# Patient Record
Sex: Female | Born: 1985 | Hispanic: No | Marital: Single
Health system: Midwestern US, Academic
[De-identification: ages and names within clinical notes are randomized; demographics above are authoritative.]

---

## 2018-11-29 ENCOUNTER — Encounter: Admit: 2018-11-29 | Discharge: 2018-11-30

## 2019-01-03 ENCOUNTER — Encounter: Admit: 2019-01-03 | Discharge: 2019-01-03

## 2019-01-03 NOTE — Telephone Encounter
Patient contacted for pre-visit information gathering r/t referral for migraines. Patient reports she went to the eye doctor and was prescribed glasses.  She has been wearing them for 3 weeks and has not had a migraine since. Reports father hx of brain cancer so that makes her nervous.    Referring provider: 11/27/2018 Alvarado   "Martha Cowan presents to the clinic today for c/o migraine wince 6/1 (1 week). Patient states she left work early and needs a note to go back to work. Hx migraines and at times needs to come in for a Toradol shot to help abort it. She has tried Tylenol and Ibuprofen which take the edge off but does not make the HA go completely away. Pain behind eyebrows and radiates up to hair line throughout bilateral forehead. Eyes water, but otherwise no vision trouble. She has been tried on Sumatriptan but states it did not work so she did not try it this time. Has never seen neuro for migraines. States they are becoming more frequent and lasting longer. She is scared because her father had a brain tumor that started with recurrent headaches. She also does not take Excedrin because of the aspirin and wants to keep her options open in case she ever needs emergent surgery. Headache mostly relieved with rest in a dark room. Does c/o light sensitivity. Denies n/v, fever, or sinus pain/pressure. No other concerns."    Previous neurologist: denies    Imaging: requested from [facility]      Sleep Study: denies      Recent ED visits (last 6 months) r/t chief complaint: denies but did go to same-day appointments with her PCP four times in 2.5 weeks for migraines.        Confirmed appointment for 01/05/2019 at 9:15am.  Also confirmed that patient aware of clinic location and visitor policy.

## 2019-01-05 ENCOUNTER — Encounter: Admit: 2019-01-05 | Discharge: 2019-01-05

## 2019-01-05 ENCOUNTER — Ambulatory Visit: Admit: 2019-01-05 | Discharge: 2019-01-06

## 2019-01-05 DIAGNOSIS — R51 Headache: Secondary | ICD-10-CM

## 2019-01-05 DIAGNOSIS — G43009 Migraine without aura, not intractable, without status migrainosus: Principal | ICD-10-CM

## 2019-01-05 DIAGNOSIS — G43909 Migraine, unspecified, not intractable, without status migrainosus: Secondary | ICD-10-CM

## 2019-01-05 MED ORDER — MAGNESIUM OXIDE 400 MG (241.3 MG MAGNESIUM) PO TAB
400 mg | ORAL_TABLET | Freq: Every day | ORAL | 4 refills | Status: AC
Start: 2019-01-05 — End: ?

## 2019-01-05 MED ORDER — ACETAMINOPHEN 500 MG PO TAB
ORAL_TABLET | 1 refills | Status: AC
Start: 2019-01-05 — End: ?

## 2019-01-05 MED ORDER — PROCHLORPERAZINE MALEATE 5 MG PO TAB
10 mg | ORAL_TABLET | ORAL | 2 refills | Status: AC | PRN
Start: 2019-01-05 — End: ?

## 2019-01-05 MED ORDER — NORTRIPTYLINE 10 MG PO CAP
ORAL_CAPSULE | Freq: Every evening | 8 refills | Status: AC
Start: 2019-01-05 — End: ?

## 2019-01-05 MED ORDER — DIPHENHYDRAMINE HCL 25 MG PO TAB
25 mg | ORAL_TABLET | ORAL | 2 refills | 6.00000 days | Status: AC | PRN
Start: 2019-01-05 — End: ?

## 2019-01-05 NOTE — Progress Notes
Date of Service: 01/05/2019    Subjective:             Martha Cowan is a 33 y.o. female coming for the first time to the Clinic for headaches.    History of Present Illness    33 yo female with h/o hypothyroidism, kidney stones, and hysterectomy, presenting to the clinic for evaluation of her headaches.    Patient started having headaches for years ago, they have always had similar characteristics.  Initially, they happened approximately once a year, however during the last year the frequency has increased to once per month, mainly associated with social and personal life stressors.  Frequency has become worse during the last 2 months, as she has had between 10 to 12 days of headache per month, prompting her to go to the ED multiple times in June.  Toradol helped during these visits, however pain came back after several hours.  She was also started on amitriptyline, and got prescription glasses, and she has not had any more headaches for the last 3 weeks.  Seems that she has been tolerating the medication, which has also helped with her sleep, however she does complain of some dry mouth.  She has been taking the amitriptyline inconsistently.     Her headaches are located behind her eyebrows, and can be of moderate to severe intensity, lasting from several hours to several days, especially in the last couple of months.  Headache has a sharp, pressure-like quality, and is bilateral.  She also has nausea, with occasional vomiting, phonophobia, photophobia, and lacrimation, reason for which other than Toradol, being in a dark room and sleeping helps somewhat. There are no other accompanying symptoms, and no other autonomic features.    She does not have symptoms consistent with an aura, without any tingling, numbness, or weakness before, during, or after the headache.  However, she does have a mild pain behind her eyebrows and eyes that builds up in 30 to 60 minutes before reaching a maximum intensity. She has not identified any particular trigger for her headaches, including any particular type of food, alcohol, weather changes.  She has not noticed any relationship with caffeine.  When exception might be stress, as she has had a very stressful year, with a lost of a family member, as stressful personal relationship, and she is about to start a new job.  She started smoking tobacco about 8 to 10 months ago, about half pack per day.  Sometimes she smokes marijuana, which helps with the pain.  No significant alcohol intake.    She has not seen a neurologist in the past.  She does have 2 MRIs of her head, one in 2017, and 1 last month in June 2020.  Both reported nonspecific findings that might be seen in migraine, otherwise unremarkable imaging studies.    PMH: Hypothyroidism, hysterectomy, kidney stones  FH: Father passed away from a brain tumor, unspecified type  SH: Tobacco smoking half pack per day for about 10 months.  No significant alcohol intake.  Occasional marijuana use.  About to start a job at a company named [employer], used to work at [employer].  Lives with her 2 children.   Allergies: reviewed  Current medications:  Levothyroxine, amitriptyline    Review of Systems   Eyes: Positive for visual disturbance.   Gastrointestinal: Positive for nausea.   Neurological: Positive for dizziness, light-headedness and headaches.   All other systems reviewed and are negative.      Objective:         ???  amitriptyline (ELAVIL) 25 mg tablet Take 25 mg by mouth at bedtime as needed.   ??? levothyroxine (SYNTHROID) 100 mcg tablet Take 100 mcg by mouth daily 30 minutes before breakfast.   ??? levothyroxine (SYNTHROID) 175 mcg tablet Take 175 mcg by mouth daily 30 minutes before breakfast.     Vitals:    01/05/19 0913   BP: 118/88   BP Source: Arm, Left Upper   Patient Position: Sitting   Pulse: 65   Weight: 90.7 kg (200 lb)   Height: 162.6 cm (64)   PainSc: Zero     Body mass index is 34.33 kg/m???.     Physical Exam General physical exam:    HEENT: normocephalic  CV: regular rate   Chest: normal configuration, no respiratory distress  Ab: soft  Skin: no rashes or lesions noticed    Neuro exam:   Mental status: alert, oriented to person/place/time  Speech:    Normal Abnormal   Fluency x    Comprehension x    Articulation x    Repetition x    Naming x        Cranial Nerves:    Normal Abnormal   II Pupils reactive, visual fields normal, fundoscopy exam shows optic discs with clear margins    III, IV, VI EOMI, no nystagmus    V Sensation nml V1-V3    VII          Symmetric Smile and Eye Closure     VIII nml to finger rub    IX, X +strong cough    XI Equal shoulder shrug    XII Tongue midline        Muscle/motor:   Tone: nml  Bulk: nml  Fasciculations: none  Pronator drift: none     NF NE SA EF EE WE WF FF FE FA TA HF HA HE KF KE DF PF In Ev TF TE   R 5 5 5 5 5 5 5 5 5 5 5 5  - 5 5 5 5 5  - - - -   L   5 5 5 5 5 5 5 5 5 5  - 5 5 5 5 5  - - - -       Sensation:    Normal RUE LUE RLE LLE   Light Touch x       Pin Prick -       Temperature x       Vibration >10sec b/l       Proprioception intact       Sensory level: none    Coordination:    Normal Abnormal Right Abnormal Left   Finger to Nose x     Rapid alternating  x     Heel to Shin x     Finger tap x     Foot tap x     Other        Gait and Sation:  Regular gait: nml  Toe walk: nml  Heel walk: nml  Tandem gait: nml  Romberg: neg    Reflexes:      Right Left   Triceps 2 2   Biceps 2 2   Brachioradialis 2 2   Patella 2 2   Ankle 2 2   Plantar down down        Assessment and Plan:      [name] is a 33 female patient with hyopothyroidism, h/o kidney stones and hysterectomy, presenting to the clinic for management of  her headaches with migrainous features.    # Headache disorder, with migrainous features  - Onset: 4 years ago  - Frequency: increasing frequency in last 2 months, 8-12 headache days per month (once every month before). - Modifiers: Toradol, sleep in a dark room make it better.  Phonophobia and photophobia present.  - Amitriptyline started prior to this visit, causing dry mouth, with good tolerance otherwise.  - No headaches during the last 3 weeks.  Got prescription glasses recently.  - Normal neurologic exam.  - Head MRI 2017, and June 2020 with nonspecific subcortical findings that can be seen in migraine disorders, otherwise unremarkable, with no structural lesions.  - With history of kidney stones would avoid topiramate.  - IMP: Prescription glasses, and started amitriptyline, has helped with her headaches; will continue prophylactic treatment for now.  Plan:  Prophylactic treatment:  - Will discontinue amitriptyline and switch to nortriptyline 10 mg every night, and titrate to 20 mg after 1 week.  Reassess xerostomia.    Abortive treatment:  - Migraine cocktail with Tylenol 500 mg, compazine 10 mg, benadryl 25 mg, magnesium oxide 400 mg.  Advised to take early with the onset of headache.  My repeat up to 3 times in a day.    Lifestyle modification:  - Patient advised to avoid treating her headaches more than 10 days/month with acute/abortive treatments.  - Behavioral and lifestyle recommendations, including tobacco cessation, and regular exercise.  - RTC in 3-4 months     # Hypothyroidism - on levothyroxine        Patient seen and discussed with Dr. Luis Abed, MD.  Neurology, PGY-2  Pager number [phone]  Available on [facility]

## 2019-01-07 ENCOUNTER — Encounter: Admit: 2019-01-07 | Discharge: 2019-01-07

## 2019-01-07 DIAGNOSIS — G43909 Migraine, unspecified, not intractable, without status migrainosus: Secondary | ICD-10-CM

## 2019-01-07 DIAGNOSIS — G43009 Migraine without aura, not intractable, without status migrainosus: Secondary | ICD-10-CM

## 2019-01-07 DIAGNOSIS — R51 Headache: Principal | ICD-10-CM

## 2019-01-10 NOTE — Progress Notes
I have personally interviewed and examined Martha Cowan and reviewed the history, examination, impression, and plan of care as outlined by Dr.Daniel Charlesetta Shanks, MD. I personally  participated in patient counseling and coordination of care.  I agree with the assessment and plan as documented by Dr. Doristine Section, MD.

## 2021-03-15 IMAGING — MR Head^Brain
9 of 14 series · 33 of 48 positions shown · non-contrast
Comparison: none

[Series 2: T1 · sagittal · 5.0mm · 0.45mm/px · 8 of 16 slices shown (1 of 4)]
[im 1/16]
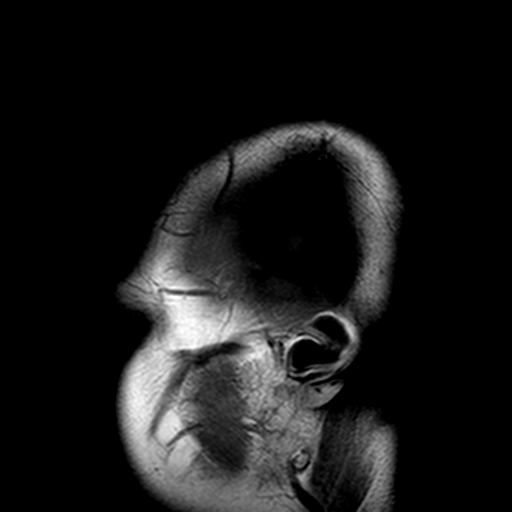
[im 3/16]
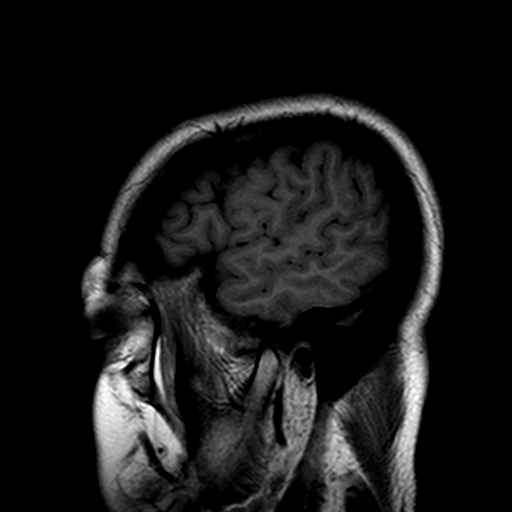
[im 5/16]
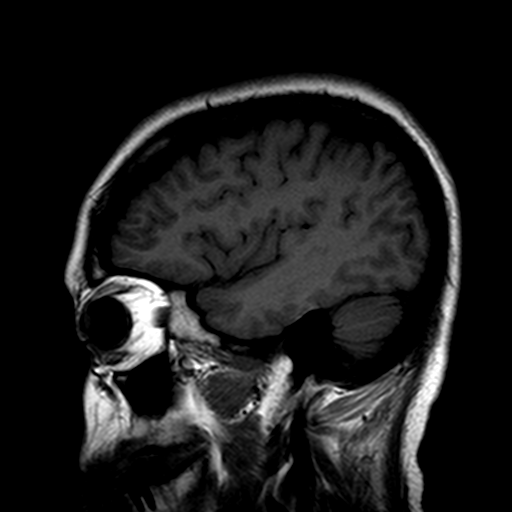
[im 7/16]
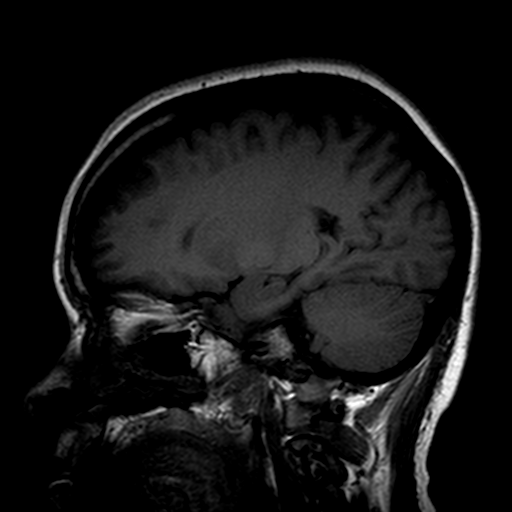
[im 9/16]
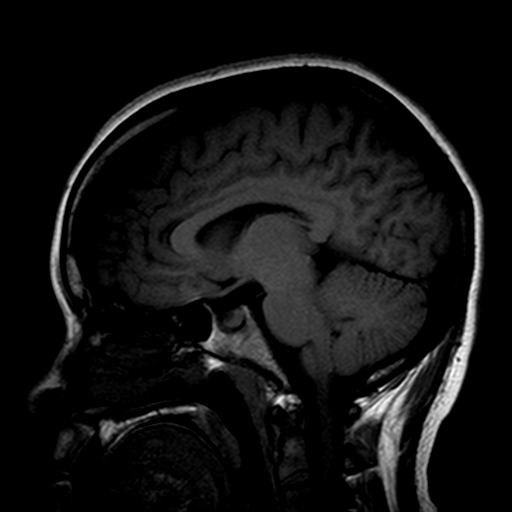
[im 11/16]
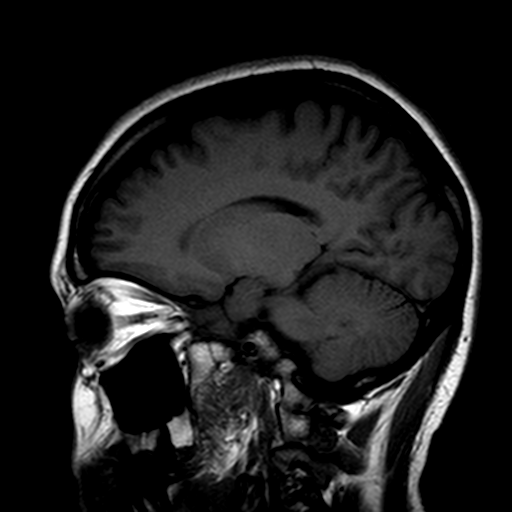
[im 13/16]
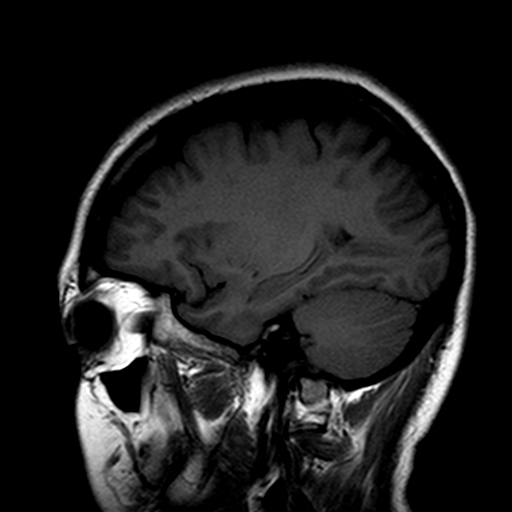
[im 16/16]
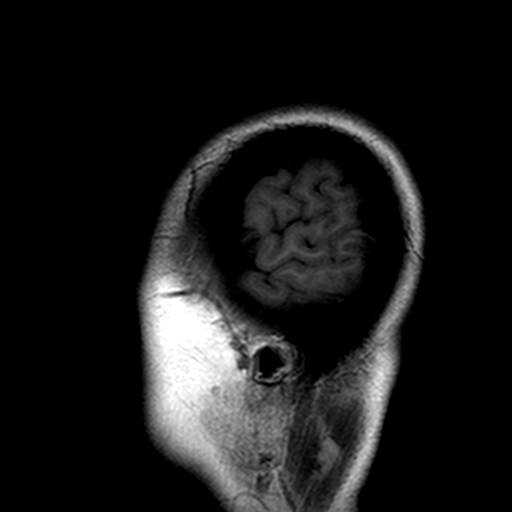

[Series 3: DWI · axial · 5.0mm · 1.80mm/px · z∈[-78,+51]mm · 8 of 35 slices shown (1 of 2)]
[im 1/35]
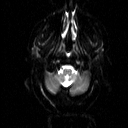
[im 6/35]
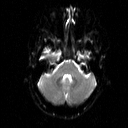
[im 11/35]
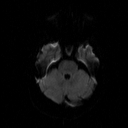
[im 16/35]
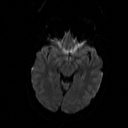
[im 19/35]
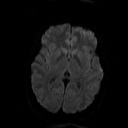
[im 24/35]
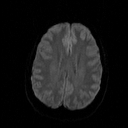
[im 29/35]
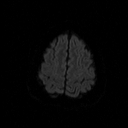
[im 35/35]
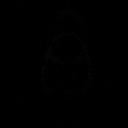

[Series 4: DWI · axial · 5.0mm · 1.80mm/px · z∈[-78,+38]mm · 4 of 10 slices shown (2 of 2)]
[im 1/10]
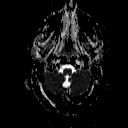
[im 4/10]
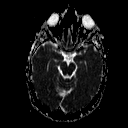
[im 7/10]
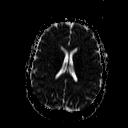
[im 10/10]
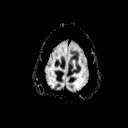

[Series 5: FLAIR · axial · 5.0mm · 0.45mm/px · z∈[-48,-16]mm · 2 of 4 slices shown]
[im 1/4]
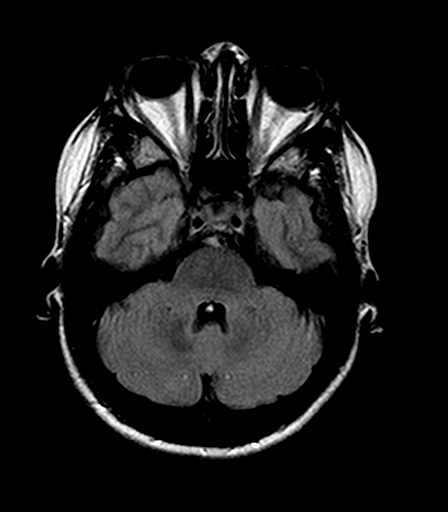
[im 4/4]
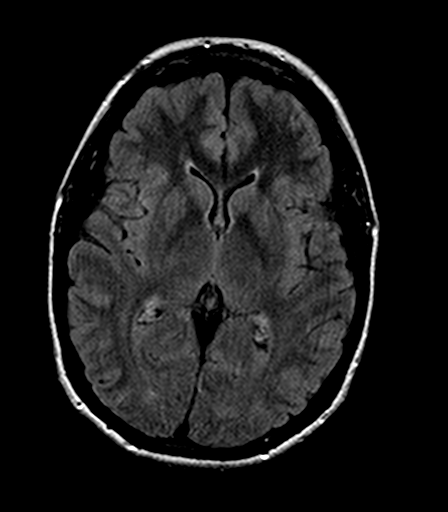

[Series 6: T1 · axial · 5.0mm · 0.45mm/px · z∈[-74,+36]mm · 2 of 6 slices shown (2 of 4)]
[im 1/6]
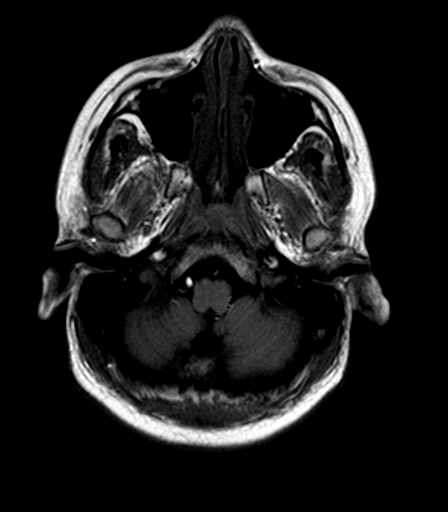
[im 6/6]
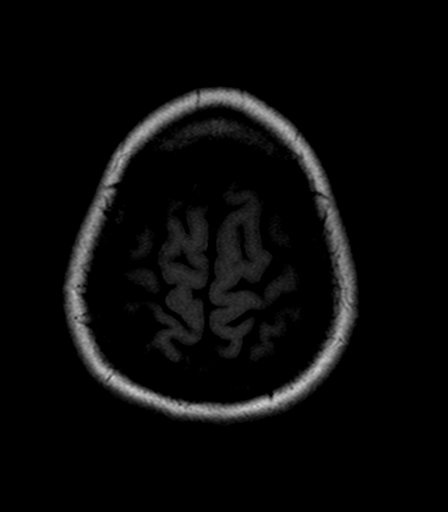

[Series 7: T2 · axial · 5.0mm · 0.72mm/px · z∈[-74,+56]mm · 5 of 12 slices shown (1 of 2)]
[im 1/12]
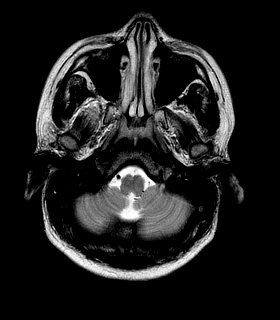
[im 3/12]
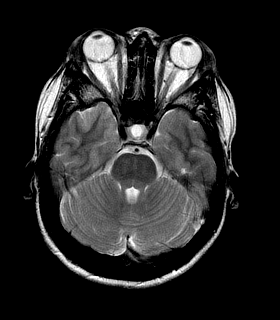
[im 6/12]
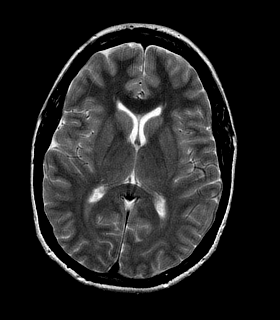
[im 9/12]
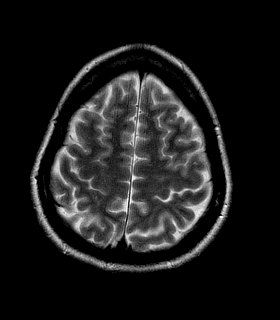
[im 12/12]
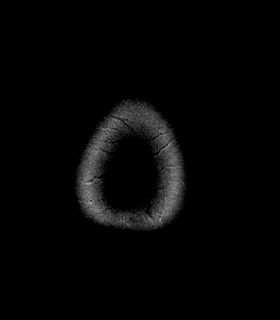

[Series 8: T1 · coronal · 3.0mm · 0.37mm/px · 1 of 3 slices shown (3 of 4)]
[im 1/3]
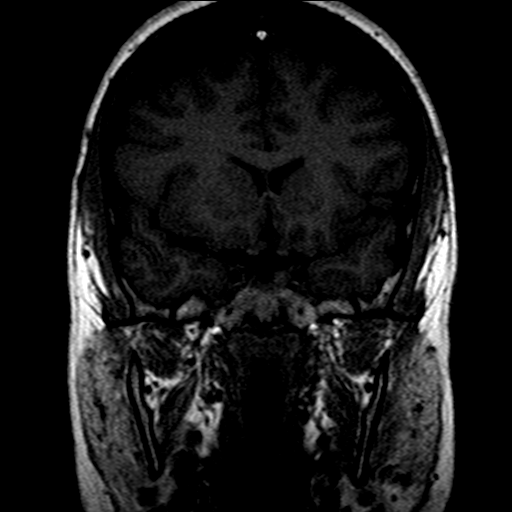

[Series 9: T2 · coronal · 3.0mm · 0.49mm/px · 1 of 3 slices shown (2 of 2)]
[im 1/3]
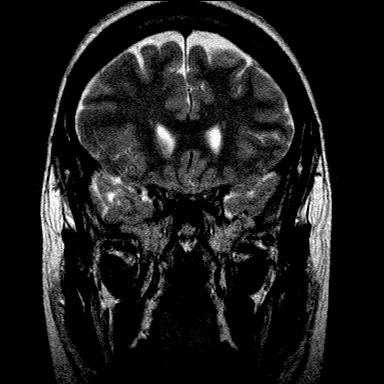

[Series 10: T1 · sagittal · 3.0mm · 0.37mm/px · 2 of 4 slices shown (4 of 4)]
[im 1/4]
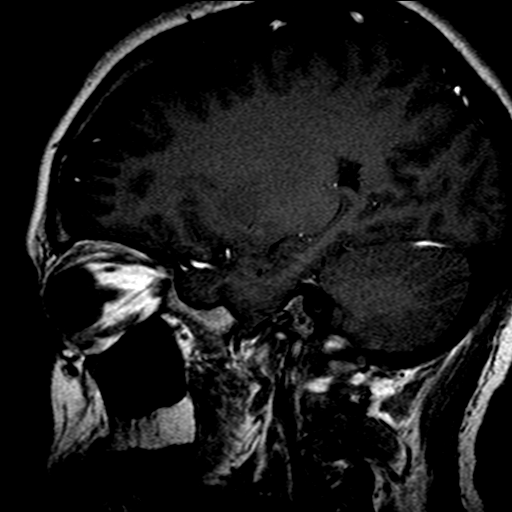
[im 4/4]
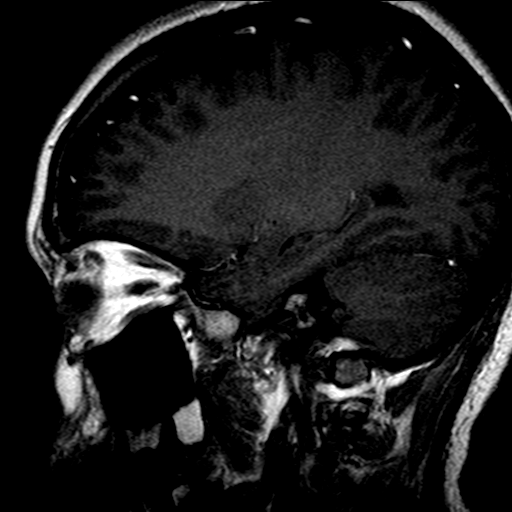

[33 of 48 positions shown; findings below may reference images not displayed]

DIAGNOSTIC STUDIES

EXAM
MAGNETIC RESONANCE IMAGING, BRAIN (INCLUDING BRAIN STEM) WITHOUT CONTRAST MATERIAL, FOLLOWED BY
CONTRAST MATERIAL(S) AND FURTHER SEQUENCES, CPT 40990

INDICATION
change in headaches, h/o thyroid disease.
PT C/O HEADACHES DAILY. HX OF THYROID GOITER X 20 YEARS AGO WITH RADIATION PILL. PT RECIEVED 15CC
GADAVIST. BG

TECHNIQUE
Multiplanar, multi sequence MR images of the brain were obtained before and after the
administration of intravenous gadolinium.

COMPARISONS
None available.

FINDINGS
On midline sagittal images there are normal flow voids in the sagittal sinuses. The corpus callosum
is normal in signal and contour. The pituitary gland is unremarkable. The brainstem is unremarkable.
There is no cerebellar tonsillar ectopia. The craniocervical junction is intact. On diffusion
weighted sequences, there is no evidence of acute or subacute infarct. On blood sensitive sequences,
there is no evidence of acute or chronic hemorrhage.There is demonstration of somewhat nonspecific
subcortical T2/FLAIR hyperintensity within the left parietal lobe white matter.The brain parenchyma
is otherwise normal in signal intensity. There is no abnormal contrast enhancement. There is no
midline shift or mass effect. The orbits and their contents are unremarkable. The paranasal sinuses
demonstrate mucosal thickening. Mastoid air cells are clear.

IMPRESSION
1. There is demonstration of a small nonspecific subcortical hyperintensity within the left
parietal lobe subcortical white matter which can be seen in the setting of migraine disorder.
Otherwise, the brain is preserved in signal intensity for age. There is no evidence of abnormal
contrast enhancement..

Tech Notes:

PT C/O HEADACHES DAILY. HX OF THYROID GOITER X 20 YEARS AGO WITH RADIATION PILL. PT RECIEVED 15CC
GADAVIST. BG
